# Patient Record
Sex: Male | Born: 1965 | Race: White | Hispanic: No | Marital: Married | Smoking: Never smoker
Health system: Southern US, Community
[De-identification: ages and names within clinical notes are randomized; demographics above are authoritative.]

## PROBLEM LIST (undated history)

## (undated) DIAGNOSIS — K429 Umbilical hernia without obstruction or gangrene: Secondary | ICD-10-CM

## (undated) DIAGNOSIS — M109 Gout, unspecified: Secondary | ICD-10-CM

## (undated) DIAGNOSIS — E7800 Pure hypercholesterolemia, unspecified: Secondary | ICD-10-CM

## (undated) DIAGNOSIS — G4733 Obstructive sleep apnea (adult) (pediatric): Secondary | ICD-10-CM

## (undated) DIAGNOSIS — J309 Allergic rhinitis, unspecified: Secondary | ICD-10-CM

## (undated) DIAGNOSIS — L719 Rosacea, unspecified: Secondary | ICD-10-CM

## (undated) HISTORY — DX: Gout, unspecified: M10.9

## (undated) HISTORY — DX: Umbilical hernia without obstruction or gangrene: K42.9

## (undated) HISTORY — DX: Rosacea, unspecified: L71.9

## (undated) HISTORY — PX: OTHER SURGICAL HISTORY: SHX169

## (undated) HISTORY — DX: Allergic rhinitis, unspecified: J30.9

## (undated) HISTORY — DX: Morbid (severe) obesity due to excess calories: E66.01

## (undated) HISTORY — DX: Obstructive sleep apnea (adult) (pediatric): G47.33

## (undated) HISTORY — PX: FACIAL COSMETIC SURGERY: SHX629

## (undated) HISTORY — DX: Pure hypercholesterolemia, unspecified: E78.00

---

## 2004-07-12 ENCOUNTER — Ambulatory Visit: Payer: Self-pay | Admitting: Internal Medicine

## 2004-08-25 ENCOUNTER — Ambulatory Visit: Payer: Self-pay | Admitting: Endocrinology

## 2004-09-01 ENCOUNTER — Ambulatory Visit: Payer: Self-pay | Admitting: Endocrinology

## 2004-10-28 ENCOUNTER — Ambulatory Visit: Payer: Self-pay | Admitting: Endocrinology

## 2004-11-03 ENCOUNTER — Ambulatory Visit: Payer: Self-pay | Admitting: Endocrinology

## 2011-01-23 ENCOUNTER — Other Ambulatory Visit (INDEPENDENT_AMBULATORY_CARE_PROVIDER_SITE_OTHER): Payer: Self-pay | Admitting: General Surgery

## 2011-01-23 ENCOUNTER — Encounter (HOSPITAL_COMMUNITY): Payer: Managed Care, Other (non HMO)

## 2011-01-23 LAB — SURGICAL PCR SCREEN
MRSA, PCR: NEGATIVE
Staphylococcus aureus: POSITIVE — AB

## 2011-01-23 LAB — BASIC METABOLIC PANEL
CO2: 27 mEq/L (ref 19–32)
Chloride: 102 mEq/L (ref 96–112)
Glucose, Bld: 95 mg/dL (ref 70–99)
Potassium: 4.4 mEq/L (ref 3.5–5.1)
Sodium: 137 mEq/L (ref 135–145)

## 2011-01-23 LAB — DIFFERENTIAL
Basophils Relative: 1 % (ref 0–1)
Eosinophils Absolute: 0.4 10*3/uL (ref 0.0–0.7)
Monocytes Relative: 10 % (ref 3–12)
Neutro Abs: 4 10*3/uL (ref 1.7–7.7)
Neutrophils Relative %: 54 % (ref 43–77)

## 2011-01-23 LAB — CBC
Hemoglobin: 15.8 g/dL (ref 13.0–17.0)
MCH: 29.9 pg (ref 26.0–34.0)
Platelets: 273 10*3/uL (ref 150–400)
RBC: 5.28 MIL/uL (ref 4.22–5.81)
WBC: 7.4 10*3/uL (ref 4.0–10.5)

## 2011-01-28 ENCOUNTER — Ambulatory Visit (HOSPITAL_COMMUNITY)
Admission: RE | Admit: 2011-01-28 | Discharge: 2011-01-28 | Disposition: A | Discharge status: Home / Self Care | Payer: Managed Care, Other (non HMO) | Source: Ambulatory Visit | Attending: General Surgery | Admitting: General Surgery

## 2011-01-28 DIAGNOSIS — K429 Umbilical hernia without obstruction or gangrene: Secondary | ICD-10-CM | POA: Insufficient documentation

## 2011-01-28 DIAGNOSIS — Z01810 Encounter for preprocedural cardiovascular examination: Secondary | ICD-10-CM | POA: Insufficient documentation

## 2011-01-28 DIAGNOSIS — Z01812 Encounter for preprocedural laboratory examination: Secondary | ICD-10-CM | POA: Insufficient documentation

## 2011-01-28 DIAGNOSIS — E669 Obesity, unspecified: Secondary | ICD-10-CM | POA: Insufficient documentation

## 2011-01-29 NOTE — Op Note (Signed)
NAMEPEIGHTON, EDGIN NO.:  [id number]  MEDICAL RECORD NO.:  [id number]  LOCATION:  DAYL                         FACILITY:  [facility]  PHYSICIAN:  Juanetta Gosling, MDDATE OF BIRTH:  06-17-1965  DATE OF PROCEDURE:  01/28/2011 DATE OF DISCHARGE:                              OPERATIVE REPORT   PREOPERATIVE DIAGNOSIS:  Umbilical hernia.  POSTOPERATIVE DIAGNOSIS:  Umbilical hernia.  PROCEDURE:  Umbilical repair with proceed ventral patch.  SURGEON:  Juanetta Gosling, MD  ASSISTANT:  None.  ANESTHESIA:  General.  SPECIMENS:  None.  DRAINS:  None.  COMPLICATIONS:  None.  BLOOD LOSS:  Minimal.  DISPOSITION:  To recovery room in stable condition.  INDICATIONS:  This is a 45 year old male who had about a several-month history of umbilical boluses and has remained about the same.  He has had some symptoms associated, which are better relieved by lying down. I saw him initially in May, he wanted to wait until he had some steps worked out, at home, that he would be able to have this repaired.  He is scheduled for September.  He and I discussed again the risks and benefits associated with umbilical hernia repair.  DESCRIPTION OF PROCEDURE:  After informed consent was obtained, the patient was taken to the operating room.  He was administered 1 g of intravenous cefazolin.  There was a questionable rash that he develops, so we stopped that and administered ciprofloxacin.  He had sequential compression devices placed on lower extremities prior to induction with anesthesia.  He was then placed under anesthesia without complication. His abdomen was prepped and draped in standard sterile surgical fashion. Surgical time-out was then performed.  I then made an incision below his umbilicus in a curvilinear fashion. This was carried down to the level of umbilical stalk.  This was encircled with a Kelly clamp.  I then divided this and pulled the umbilicus up.  I  then reduced the hernia, it was about a centimeter and half size hernias and due to his body habitus, I elected to repair this with mesh.  I placed a Proceed ventral patch in place.  I had a fair amount of preperitoneal fat that I was able to clean off as well.  I then placed the bottom of the layer of the mesh flat against the abdominal wall and spread this open and I then pulled the connectors up. I did close either end of the defect with #1 Ethibond figure-of-eight sutures.  I then tacked the straps to the edge of the fascia and then tacked the edges of the straps down.  This appeared to obliterate the defect completely.  I then tacked his umbilicus down with 2-0 Vicryl, then closed the skin with 3-0 Vicryl, 4-0 Monocryl.  Dermabond was placed over this, 10 mL of 0.25% Marcaine were filtrated throughout this.  He tolerated this well, was extubated, and was transferred to recovery in stable condition.     Juanetta Gosling, MD     MCW/MEDQ  D:  01/28/2011  T:  01/28/2011  Job:  [id number]  cc:   Sigmund Hazel, M.D. Fax: [phone]  Electronically Signed by Emelia Loron MD on 01/29/2011 06:24:48 PM

## 2011-02-02 ENCOUNTER — Telehealth (INDEPENDENT_AMBULATORY_CARE_PROVIDER_SITE_OTHER): Payer: Self-pay

## 2011-02-02 NOTE — Telephone Encounter (Signed)
Pt called complaining of a lump a little above his umbilicus where the hernia was repaired.  He states it is not painful and he has no fever.  I told him this is probably a seroma and will get better.  He will give it more time.

## 2011-02-04 HISTORY — PX: HERNIA REPAIR: SHX51

## 2011-02-23 ENCOUNTER — Encounter (INDEPENDENT_AMBULATORY_CARE_PROVIDER_SITE_OTHER): Payer: Self-pay | Admitting: General Surgery

## 2011-02-23 ENCOUNTER — Ambulatory Visit (INDEPENDENT_AMBULATORY_CARE_PROVIDER_SITE_OTHER): Payer: Managed Care, Other (non HMO) | Admitting: General Surgery

## 2011-02-23 VITALS — BP 138/92 | HR 66 | Temp 97.1°F | Resp 16 | Ht 73.0 in | Wt 261.2 lb

## 2011-02-23 DIAGNOSIS — Z09 Encounter for follow-up examination after completed treatment for conditions other than malignant neoplasm: Secondary | ICD-10-CM

## 2011-02-23 NOTE — Progress Notes (Signed)
Subjective:     Patient ID: Gary Powers, male   DOB: 1965/08/26, 45 y.o.   MRN: [id number]  HPI This is a 45 year old male who is now almost a month out from an umbilical hernia repair with mesh. He is doing really well with no real complaints at all. He is still not back to normal his normal activity per the restrictions afterwards. He he does have some swelling that is present around the umbilicus and it looks like his umbilicus was lifted up with a little bit of a seroma. His otherwise has no complaints.  Review of Systems     Objective:   Physical Exam Well healed umbilical incision with seroma present      Assessment:     S/p uh repair    Plan:        He is doing very well I released and full activity now. He does have a seroma in his umbilicus is lifted up a little bit. I discussed with him placing a piece of gauze to try to stick this back down. I told him it would likely do it on its own over some time as well. He is willing to see what happens over some time. I'm going to see him back as needed. I told him if this area still bothering him and exceed most please call me.

## 2013-03-24 ENCOUNTER — Encounter: Payer: Self-pay | Admitting: Cardiology

## 2016-08-24 ENCOUNTER — Ambulatory Visit (INDEPENDENT_AMBULATORY_CARE_PROVIDER_SITE_OTHER): Payer: BLUE CROSS/BLUE SHIELD | Admitting: Psychology

## 2016-08-24 DIAGNOSIS — F4320 Adjustment disorder, unspecified: Secondary | ICD-10-CM

## 2016-09-21 ENCOUNTER — Ambulatory Visit: Payer: BLUE CROSS/BLUE SHIELD | Admitting: Psychology

## 2016-09-25 DIAGNOSIS — Z1211 Encounter for screening for malignant neoplasm of colon: Secondary | ICD-10-CM | POA: Diagnosis not present

## 2016-09-25 DIAGNOSIS — D126 Benign neoplasm of colon, unspecified: Secondary | ICD-10-CM | POA: Diagnosis not present

## 2016-09-29 DIAGNOSIS — D126 Benign neoplasm of colon, unspecified: Secondary | ICD-10-CM | POA: Diagnosis not present

## 2017-01-07 DIAGNOSIS — J069 Acute upper respiratory infection, unspecified: Secondary | ICD-10-CM | POA: Diagnosis not present

## 2017-04-17 ENCOUNTER — Emergency Department (HOSPITAL_BASED_OUTPATIENT_CLINIC_OR_DEPARTMENT_OTHER): Payer: BLUE CROSS/BLUE SHIELD

## 2017-04-17 ENCOUNTER — Encounter (HOSPITAL_BASED_OUTPATIENT_CLINIC_OR_DEPARTMENT_OTHER): Payer: Self-pay | Admitting: Emergency Medicine

## 2017-04-17 ENCOUNTER — Emergency Department (HOSPITAL_BASED_OUTPATIENT_CLINIC_OR_DEPARTMENT_OTHER)
Admission: EM | Admit: 2017-04-17 | Discharge: 2017-04-17 | Disposition: A | Discharge status: Home / Self Care | Payer: BLUE CROSS/BLUE SHIELD | Attending: Emergency Medicine | Admitting: Emergency Medicine

## 2017-04-17 ENCOUNTER — Other Ambulatory Visit: Payer: Self-pay

## 2017-04-17 DIAGNOSIS — S59802A Other specified injuries of left elbow, initial encounter: Secondary | ICD-10-CM | POA: Diagnosis not present

## 2017-04-17 DIAGNOSIS — W010XXA Fall on same level from slipping, tripping and stumbling without subsequent striking against object, initial encounter: Secondary | ICD-10-CM | POA: Insufficient documentation

## 2017-04-17 DIAGNOSIS — Y999 Unspecified external cause status: Secondary | ICD-10-CM | POA: Insufficient documentation

## 2017-04-17 DIAGNOSIS — S46212A Strain of muscle, fascia and tendon of other parts of biceps, left arm, initial encounter: Secondary | ICD-10-CM | POA: Insufficient documentation

## 2017-04-17 DIAGNOSIS — S59912A Unspecified injury of left forearm, initial encounter: Secondary | ICD-10-CM | POA: Diagnosis not present

## 2017-04-17 DIAGNOSIS — S4992XA Unspecified injury of left shoulder and upper arm, initial encounter: Secondary | ICD-10-CM | POA: Diagnosis not present

## 2017-04-17 DIAGNOSIS — Y9389 Activity, other specified: Secondary | ICD-10-CM | POA: Insufficient documentation

## 2017-04-17 DIAGNOSIS — S59902A Unspecified injury of left elbow, initial encounter: Secondary | ICD-10-CM | POA: Diagnosis not present

## 2017-04-17 DIAGNOSIS — M79632 Pain in left forearm: Secondary | ICD-10-CM | POA: Diagnosis not present

## 2017-04-17 DIAGNOSIS — Y92520 Airport as the place of occurrence of the external cause: Secondary | ICD-10-CM | POA: Insufficient documentation

## 2017-04-17 DIAGNOSIS — M79622 Pain in left upper arm: Secondary | ICD-10-CM | POA: Diagnosis not present

## 2017-04-17 NOTE — ED Notes (Signed)
PMS intact before and after. Pt tolerated well. All questions answered. Per MD pt is only to have ace wrap and sling not a long arm splint.

## 2017-04-17 NOTE — ED Notes (Signed)
ED Provider at bedside. 

## 2017-04-17 NOTE — ED Notes (Signed)
Pt given d/c instructions as per chart. Verbalizes understanding. No questions. 

## 2017-04-17 NOTE — ED Triage Notes (Signed)
PT presents with c/o left arm pain and swelling after he [place] at airport and hit his upper arm on steel. Swelling noted to left AC area pt unable to rotate forearm

## 2017-04-17 NOTE — ED Provider Notes (Signed)
[facility] EMERGENCY DEPARTMENT Provider Note   CSN: [id number] Arrival date & time: 04/17/17  1931     History   Chief Complaint Chief Complaint  Patient presents with  . Arm Injury    HPI Gary Powers is a 51 y.o. male.  The history is provided by the patient. No language interpreter was used.  Arm Injury      Gary Powers is a 51 y.o. male who presents to the Emergency Department complaining of arm injury.  He was walking through the airport today when he tripped and fell, striking his left arm.  He believes he struck the outer portion of his upper arm but he is unsure.  He reports pain throughout his elbow but predominantly around his AC fossa.  Pain is worse with flexion of the elbow as well as pronation.  No numbness or weakness.  No prior similar symptoms.  He is right-hand dominant.  History reviewed. No pertinent past medical history.  There are no active problems to display for this patient.   Past Surgical History:  Procedure Laterality Date  . HERNIA REPAIR  02/04/11   [facility] with PVP       Home Medications    Prior to Admission medications   Not on File    Family History Family History  Problem Relation Age of Onset  . Arthritis Mother   . Cancer Father        lung    Social History Social History   Tobacco Use  . Smoking status: Never Smoker  . Smokeless tobacco: Never Used  Substance Use Topics  . Alcohol use: Yes    Comment: 3 drinks per week  . Drug use: No     Allergies   Patient has no known allergies.   Review of Systems Review of Systems  All other systems reviewed and are negative.    Physical Exam Updated Vital Signs BP (!) 158/95 (BP Location: Right Arm)   Pulse 68   Temp 97.7 F (36.5 C) (Oral)   Resp 20   Ht 6' (1.829 m)   Wt 122.5 kg (270 lb)   SpO2 99%   BMI 36.62 kg/m   Physical Exam  Constitutional: He is oriented to person, place, and time. He appears well-developed and  well-nourished.  HENT:  Head: Normocephalic and atraumatic.  Neck: Neck supple.  Cardiovascular: Normal rate and regular rhythm.  Pulmonary/Chest: Effort normal. No respiratory distress.  Musculoskeletal:  2+ radial pulses bilaterally.  There is mild tenderness to palpation over the [facility] fossa with no discrete bony tenderness to palpation.  Flexion extension is intact and the elbow and range of motion is presents with throughout the shoulder and wrist.  There is pulling of the skin in the [facility] fossa with an asymmetry compared to the unaffected side.  Neurological: He is alert and oriented to person, place, and time.  5 out of 5 grip strength bilaterally with sensation to light touch intact bilaterally.  Skin: Skin is warm and dry. Capillary refill takes less than 2 seconds.  Psychiatric: He has a normal mood and affect. His behavior is normal.  Nursing note and vitals reviewed.    ED Treatments / Results  Labs (all labs ordered are listed, but only abnormal results are displayed) Labs Reviewed - No data to display  EKG  EKG Interpretation None       Radiology Dg Elbow Complete Left  Result Date: 04/17/2017 CLINICAL DATA:  [place]  at airport today. EXAM: LEFT ELBOW - COMPLETE 3+ VIEW COMPARISON:  None. FINDINGS: There is no evidence of fracture, dislocation, or joint effusion. There is no evidence of arthropathy or other focal bone abnormality. Soft tissues are unremarkable. IMPRESSION: Negative. Electronically Signed   By: Awilda Metroourtnay  Bloomer M.D.   On: 04/17/2017 22:45   Dg Forearm Left  Result Date: 04/17/2017 CLINICAL DATA:  Pain following fall EXAM: LEFT FOREARM - 2 VIEW COMPARISON:  None. FINDINGS: No fracture or dislocation. Joint spaces appear normal. No elbow joint effusion. There is a minus ulnar variant. IMPRESSION: No fracture or dislocation. No appreciable arthropathy. There is a minus ulnar variant. Electronically Signed   By: Bretta BangWilliam  Woodruff III M.D.   On:  04/17/2017 20:12   Dg Humerus Left  Result Date: 04/17/2017 CLINICAL DATA:  Pain after trauma. EXAM: LEFT HUMERUS - 2+ VIEW COMPARISON:  None. FINDINGS: There is no evidence of fracture or other focal bone lesions. Soft tissues are unremarkable. IMPRESSION: Negative. Electronically Signed   By: Gerome Samavid  Williams III M.D   On: 04/17/2017 20:13    Procedures Procedures (including critical care time)  Medications Ordered in ED Medications - No data to display   Initial Impression / Assessment and Plan / ED Course  I have reviewed the triage vital signs and the nursing notes.  Pertinent labs & imaging results that were available during my care of the patient were reviewed by me and considered in my medical decision making (see chart for details).    Patient here for evaluation of left arm pain following a fall earlier today.  He is neurovascularly intact on examination but does have some exam findings concerning for a biceps tendon rupture.  Plan to place in sling with compressive Ace wrap for comfort.  Discussed importance of orthopedics follow-up as well as return precautions.  Recommend ibuprofen at home as needed for pain.    Final Clinical Impressions(s) / ED Diagnoses   Final diagnoses:  Rupture of left biceps tendon, initial encounter    ED Discharge Orders    None       Tilden Fossaees, Kripa Foskey, MD 04/18/17 [phone]

## 2017-04-20 DIAGNOSIS — M25522 Pain in left elbow: Secondary | ICD-10-CM | POA: Diagnosis not present

## 2017-04-22 DIAGNOSIS — M25522 Pain in left elbow: Secondary | ICD-10-CM | POA: Diagnosis not present

## 2017-05-05 DIAGNOSIS — G8918 Other acute postprocedural pain: Secondary | ICD-10-CM | POA: Diagnosis not present

## 2017-05-05 DIAGNOSIS — S46292A Other injury of muscle, fascia and tendon of other parts of biceps, left arm, initial encounter: Secondary | ICD-10-CM | POA: Diagnosis not present

## 2017-05-05 DIAGNOSIS — X58XXXA Exposure to other specified factors, initial encounter: Secondary | ICD-10-CM | POA: Diagnosis not present

## 2017-05-05 DIAGNOSIS — M66829 Spontaneous rupture of other tendons, unspecified upper arm: Secondary | ICD-10-CM | POA: Diagnosis not present

## 2017-05-05 DIAGNOSIS — M66822 Spontaneous rupture of other tendons, left upper arm: Secondary | ICD-10-CM | POA: Diagnosis not present

## 2017-05-05 DIAGNOSIS — Y999 Unspecified external cause status: Secondary | ICD-10-CM | POA: Diagnosis not present

## 2017-05-18 DIAGNOSIS — M66822 Spontaneous rupture of other tendons, left upper arm: Secondary | ICD-10-CM | POA: Diagnosis not present

## 2017-05-21 DIAGNOSIS — M25622 Stiffness of left elbow, not elsewhere classified: Secondary | ICD-10-CM | POA: Diagnosis not present

## 2017-05-21 DIAGNOSIS — R531 Weakness: Secondary | ICD-10-CM | POA: Diagnosis not present

## 2017-05-21 DIAGNOSIS — M669 Spontaneous rupture of unspecified tendon: Secondary | ICD-10-CM | POA: Diagnosis not present

## 2017-05-21 DIAGNOSIS — S46202D Unspecified injury of muscle, fascia and tendon of other parts of biceps, left arm, subsequent encounter: Secondary | ICD-10-CM | POA: Diagnosis not present

## 2017-05-31 DIAGNOSIS — R531 Weakness: Secondary | ICD-10-CM | POA: Diagnosis not present

## 2017-05-31 DIAGNOSIS — M669 Spontaneous rupture of unspecified tendon: Secondary | ICD-10-CM | POA: Diagnosis not present

## 2017-05-31 DIAGNOSIS — S46202D Unspecified injury of muscle, fascia and tendon of other parts of biceps, left arm, subsequent encounter: Secondary | ICD-10-CM | POA: Diagnosis not present

## 2017-05-31 DIAGNOSIS — M25622 Stiffness of left elbow, not elsewhere classified: Secondary | ICD-10-CM | POA: Diagnosis not present

## 2017-06-15 DIAGNOSIS — M25622 Stiffness of left elbow, not elsewhere classified: Secondary | ICD-10-CM | POA: Diagnosis not present

## 2017-06-16 DIAGNOSIS — S46202D Unspecified injury of muscle, fascia and tendon of other parts of biceps, left arm, subsequent encounter: Secondary | ICD-10-CM | POA: Diagnosis not present

## 2017-06-16 DIAGNOSIS — R531 Weakness: Secondary | ICD-10-CM | POA: Diagnosis not present

## 2017-06-16 DIAGNOSIS — M25622 Stiffness of left elbow, not elsewhere classified: Secondary | ICD-10-CM | POA: Diagnosis not present

## 2017-06-23 DIAGNOSIS — M669 Spontaneous rupture of unspecified tendon: Secondary | ICD-10-CM | POA: Diagnosis not present

## 2017-06-23 DIAGNOSIS — S46202D Unspecified injury of muscle, fascia and tendon of other parts of biceps, left arm, subsequent encounter: Secondary | ICD-10-CM | POA: Diagnosis not present

## 2017-06-23 DIAGNOSIS — R531 Weakness: Secondary | ICD-10-CM | POA: Diagnosis not present

## 2017-06-23 DIAGNOSIS — M25622 Stiffness of left elbow, not elsewhere classified: Secondary | ICD-10-CM | POA: Diagnosis not present

## 2017-07-05 DIAGNOSIS — M25622 Stiffness of left elbow, not elsewhere classified: Secondary | ICD-10-CM | POA: Diagnosis not present

## 2017-07-05 DIAGNOSIS — S46202D Unspecified injury of muscle, fascia and tendon of other parts of biceps, left arm, subsequent encounter: Secondary | ICD-10-CM | POA: Diagnosis not present

## 2017-07-05 DIAGNOSIS — M669 Spontaneous rupture of unspecified tendon: Secondary | ICD-10-CM | POA: Diagnosis not present

## 2017-07-15 DIAGNOSIS — F4323 Adjustment disorder with mixed anxiety and depressed mood: Secondary | ICD-10-CM | POA: Diagnosis not present

## 2017-07-20 DIAGNOSIS — F4323 Adjustment disorder with mixed anxiety and depressed mood: Secondary | ICD-10-CM | POA: Diagnosis not present

## 2017-07-26 DIAGNOSIS — F4323 Adjustment disorder with mixed anxiety and depressed mood: Secondary | ICD-10-CM | POA: Diagnosis not present

## 2017-07-27 DIAGNOSIS — M25622 Stiffness of left elbow, not elsewhere classified: Secondary | ICD-10-CM | POA: Diagnosis not present

## 2017-11-29 DIAGNOSIS — E7800 Pure hypercholesterolemia, unspecified: Secondary | ICD-10-CM | POA: Diagnosis not present

## 2017-11-29 DIAGNOSIS — M109 Gout, unspecified: Secondary | ICD-10-CM | POA: Diagnosis not present

## 2018-01-21 DIAGNOSIS — N50811 Right testicular pain: Secondary | ICD-10-CM | POA: Diagnosis not present

## 2018-01-21 DIAGNOSIS — Z202 Contact with and (suspected) exposure to infections with a predominantly sexual mode of transmission: Secondary | ICD-10-CM | POA: Diagnosis not present

## 2018-08-23 IMAGING — CR DG FOREARM 2V*L*
2 series · 2 of 2 positions shown · non-contrast
Comparison: None.

CLINICAL DATA: Pain following fall

EXAM:
LEFT FOREARM - 2 VIEW

[x forearm ap left]
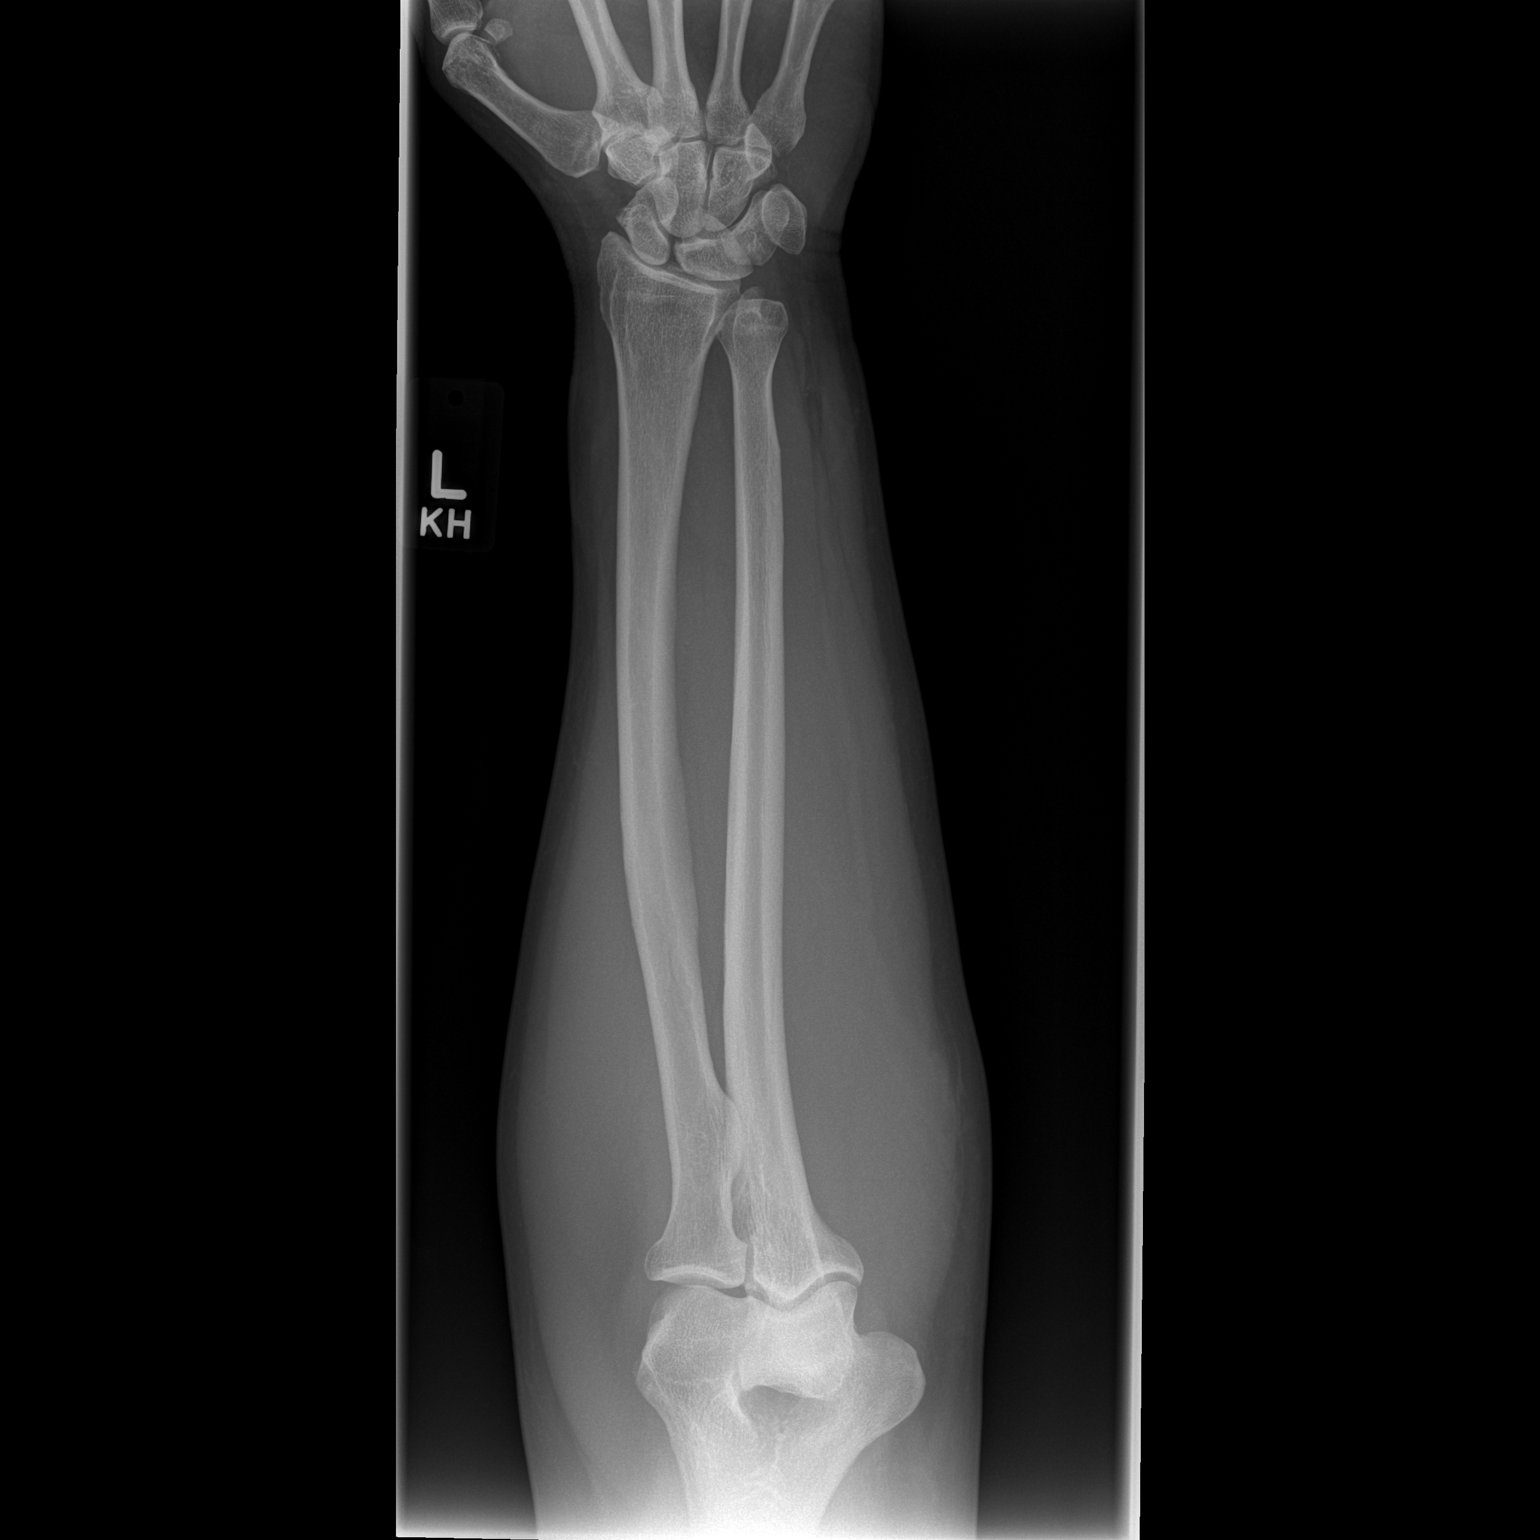

[x forearm lat left]
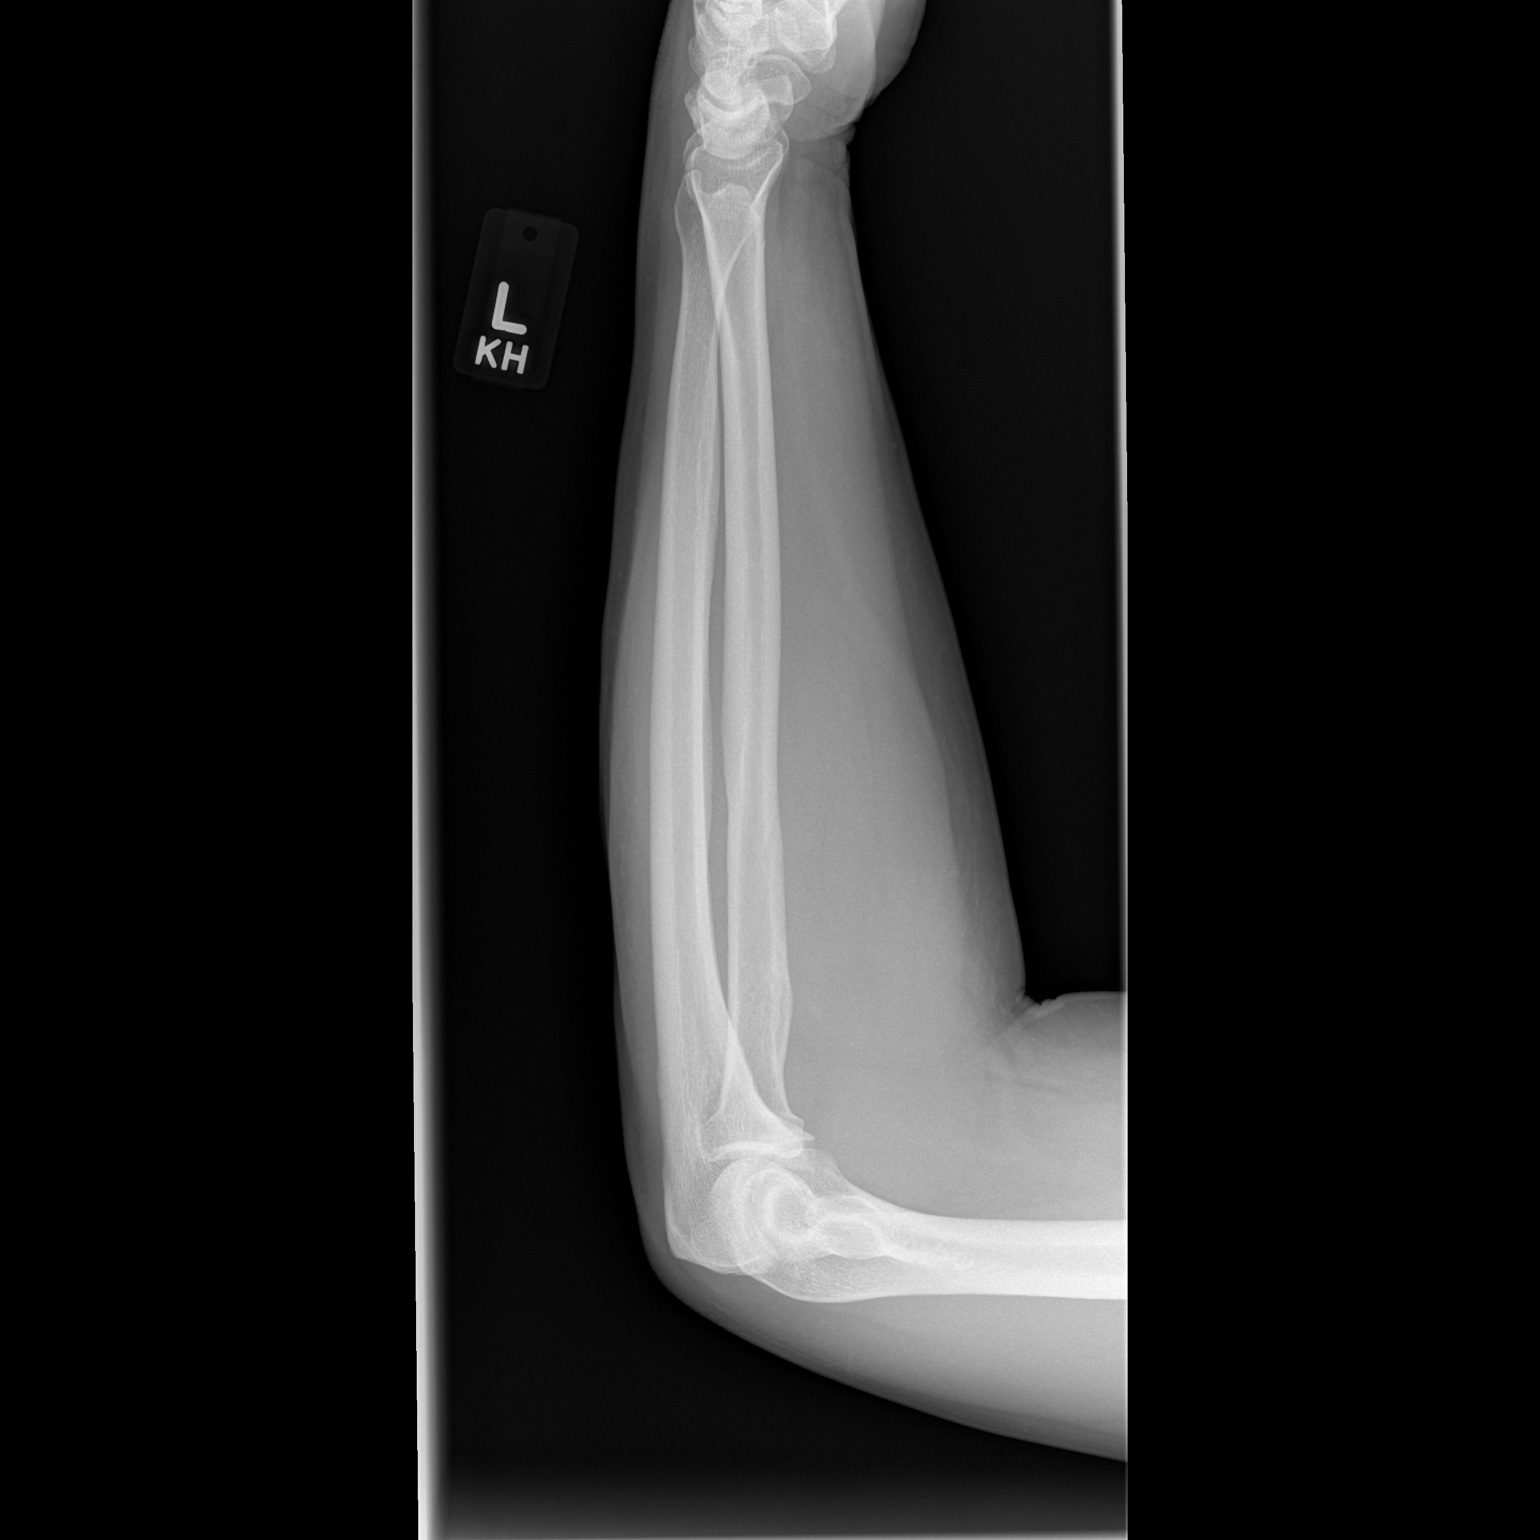

[2 of 2 positions shown; findings below may reference images not displayed]

FINDINGS: No fracture or dislocation. Joint spaces appear normal. No elbow
joint effusion. There is a minus ulnar variant.
IMPRESSION: No fracture or dislocation. No appreciable arthropathy. There is a
minus ulnar variant.

## 2018-11-29 DIAGNOSIS — H5789 Other specified disorders of eye and adnexa: Secondary | ICD-10-CM | POA: Diagnosis not present

## 2018-11-29 DIAGNOSIS — E7800 Pure hypercholesterolemia, unspecified: Secondary | ICD-10-CM | POA: Diagnosis not present

## 2018-11-29 DIAGNOSIS — M109 Gout, unspecified: Secondary | ICD-10-CM | POA: Diagnosis not present

## 2019-09-07 DIAGNOSIS — J029 Acute pharyngitis, unspecified: Secondary | ICD-10-CM | POA: Diagnosis not present

## 2019-09-07 DIAGNOSIS — Z20818 Contact with and (suspected) exposure to other bacterial communicable diseases: Secondary | ICD-10-CM | POA: Diagnosis not present

## 2020-07-11 DIAGNOSIS — G4733 Obstructive sleep apnea (adult) (pediatric): Secondary | ICD-10-CM | POA: Diagnosis not present

## 2020-11-12 DIAGNOSIS — R635 Abnormal weight gain: Secondary | ICD-10-CM | POA: Diagnosis not present

## 2020-11-12 DIAGNOSIS — M109 Gout, unspecified: Secondary | ICD-10-CM | POA: Diagnosis not present

## 2020-11-12 DIAGNOSIS — Z23 Encounter for immunization: Secondary | ICD-10-CM | POA: Diagnosis not present

## 2020-11-12 DIAGNOSIS — Z0000 Encounter for general adult medical examination without abnormal findings: Secondary | ICD-10-CM | POA: Diagnosis not present

## 2020-11-12 DIAGNOSIS — E7800 Pure hypercholesterolemia, unspecified: Secondary | ICD-10-CM | POA: Diagnosis not present

## 2020-11-12 DIAGNOSIS — M25541 Pain in joints of right hand: Secondary | ICD-10-CM | POA: Diagnosis not present

## 2020-11-12 DIAGNOSIS — Z125 Encounter for screening for malignant neoplasm of prostate: Secondary | ICD-10-CM | POA: Diagnosis not present

## 2022-06-11 DIAGNOSIS — E7800 Pure hypercholesterolemia, unspecified: Secondary | ICD-10-CM | POA: Diagnosis not present

## 2022-06-11 DIAGNOSIS — R635 Abnormal weight gain: Secondary | ICD-10-CM | POA: Diagnosis not present

## 2022-06-11 DIAGNOSIS — M109 Gout, unspecified: Secondary | ICD-10-CM | POA: Diagnosis not present

## 2022-06-12 DIAGNOSIS — E7800 Pure hypercholesterolemia, unspecified: Secondary | ICD-10-CM | POA: Diagnosis not present

## 2022-06-12 DIAGNOSIS — M109 Gout, unspecified: Secondary | ICD-10-CM | POA: Diagnosis not present

## 2022-06-12 DIAGNOSIS — Z125 Encounter for screening for malignant neoplasm of prostate: Secondary | ICD-10-CM | POA: Diagnosis not present

## 2022-08-10 DIAGNOSIS — D485 Neoplasm of uncertain behavior of skin: Secondary | ICD-10-CM | POA: Diagnosis not present

## 2022-08-10 DIAGNOSIS — D2261 Melanocytic nevi of right upper limb, including shoulder: Secondary | ICD-10-CM | POA: Diagnosis not present

## 2022-08-10 DIAGNOSIS — D225 Melanocytic nevi of trunk: Secondary | ICD-10-CM | POA: Diagnosis not present

## 2022-08-10 DIAGNOSIS — L821 Other seborrheic keratosis: Secondary | ICD-10-CM | POA: Diagnosis not present

## 2022-08-10 DIAGNOSIS — L814 Other melanin hyperpigmentation: Secondary | ICD-10-CM | POA: Diagnosis not present

## 2022-09-03 DIAGNOSIS — M109 Gout, unspecified: Secondary | ICD-10-CM | POA: Diagnosis not present

## 2022-09-03 DIAGNOSIS — Z1389 Encounter for screening for other disorder: Secondary | ICD-10-CM | POA: Diagnosis not present

## 2022-09-03 DIAGNOSIS — R739 Hyperglycemia, unspecified: Secondary | ICD-10-CM | POA: Diagnosis not present

## 2022-09-03 DIAGNOSIS — G4733 Obstructive sleep apnea (adult) (pediatric): Secondary | ICD-10-CM | POA: Diagnosis not present

## 2022-09-03 DIAGNOSIS — E669 Obesity, unspecified: Secondary | ICD-10-CM | POA: Diagnosis not present

## 2022-09-03 DIAGNOSIS — Z9189 Other specified personal risk factors, not elsewhere classified: Secondary | ICD-10-CM | POA: Diagnosis not present

## 2022-09-03 DIAGNOSIS — E7800 Pure hypercholesterolemia, unspecified: Secondary | ICD-10-CM | POA: Diagnosis not present

## 2022-09-03 DIAGNOSIS — E8889 Other specified metabolic disorders: Secondary | ICD-10-CM | POA: Diagnosis not present

## 2022-09-17 ENCOUNTER — Telehealth: Payer: Self-pay | Admitting: *Deleted

## 2022-09-17 NOTE — Telephone Encounter (Signed)
Received request from Dr Anne Fu to have this pt scheduled to be established with him as a new pt.  Left message on pt's voicemail to call back to schedule.

## 2022-09-21 DIAGNOSIS — E7800 Pure hypercholesterolemia, unspecified: Secondary | ICD-10-CM | POA: Diagnosis not present

## 2022-09-21 DIAGNOSIS — G4733 Obstructive sleep apnea (adult) (pediatric): Secondary | ICD-10-CM | POA: Diagnosis not present

## 2022-09-21 DIAGNOSIS — R7303 Prediabetes: Secondary | ICD-10-CM | POA: Diagnosis not present

## 2022-09-21 DIAGNOSIS — E559 Vitamin D deficiency, unspecified: Secondary | ICD-10-CM | POA: Diagnosis not present

## 2022-09-22 NOTE — Telephone Encounter (Signed)
Pt has been scheduled as new pt with Dr Anne Fu as requested.

## 2022-11-16 ENCOUNTER — Ambulatory Visit: Payer: BC Managed Care – PPO | Attending: Cardiology | Admitting: Cardiology

## 2022-11-16 ENCOUNTER — Encounter: Payer: Self-pay | Admitting: Cardiology

## 2022-11-16 VITALS — BP 146/92 | HR 67 | Ht 72.0 in | Wt 285.2 lb

## 2022-11-16 DIAGNOSIS — R0609 Other forms of dyspnea: Secondary | ICD-10-CM | POA: Diagnosis not present

## 2022-11-16 DIAGNOSIS — Z789 Other specified health status: Secondary | ICD-10-CM | POA: Diagnosis not present

## 2022-11-16 DIAGNOSIS — E785 Hyperlipidemia, unspecified: Secondary | ICD-10-CM

## 2022-11-16 DIAGNOSIS — I209 Angina pectoris, unspecified: Secondary | ICD-10-CM | POA: Diagnosis not present

## 2022-11-16 MED ORDER — METOPROLOL TARTRATE 25 MG PO TABS: 25.0000 mg | ORAL_TABLET | ORAL | 0 refills | Status: DC

## 2022-11-16 NOTE — Patient Instructions (Signed)
Medication Instructions:  The current medical regimen is effective;  continue present plan and medications.  *If you need a refill on your cardiac medications before your next appointment, please call your pharmacy*   Lab Work: None today If you have labs (blood work) drawn today and your tests are completely normal, you will receive your results only by: MyChart Message (if you have MyChart) OR A paper copy in the mail If you have any lab test that is abnormal or we need to change your treatment, we will call you to review the results.   You have been referred to our Lipid Clinic with our pharmacy team.  Testing/Procedures:   Your cardiac CT will be scheduled at:   [facility] [address] [place], [place] [address] [phone]  Please arrive at the [facility] and Children's Entrance (Entrance C2) of [facility] 30 minutes prior to test start time. You can use the FREE valet parking offered at entrance C (encouraged to control the heart rate for the test)  Proceed to the [facility] Radiology Department (first floor) to check-in and test prep.  All radiology patients and guests should use entrance C2 at [facility], accessed from [facility], even though the hospital's physical address listed is [address].    Please follow these instructions carefully (unless otherwise directed):  An IV will be required for this test and Nitroglycerin will be given.  Hold all erectile dysfunction medications at least 3 days (72 hrs) prior to test. (Ie viagra, cialis, sildenafil, tadalafil, etc)   On the Night Before the Test: Be sure to Drink plenty of water. Do not consume any caffeinated/decaffeinated beverages or chocolate 12 hours prior to your test. Do not take any antihistamines 12 hours prior to your test.  On the Day of the Test: Drink plenty of water until 1 hour prior to the test. Do not eat any food 1 hour prior to  test. You may take your regular medications prior to the test.  Take metoprolol (Lopressor) two hours prior to test. If you take Furosemide/Hydrochlorothiazide/Spironolactone, please HOLD on the morning of the test.      After the Test: Drink plenty of water. After receiving IV contrast, you may experience a mild flushed feeling. This is normal. On occasion, you may experience a mild rash up to 24 hours after the test. This is not dangerous. If this occurs, you can take Benadryl 25 mg and increase your fluid intake. If you experience trouble breathing, this can be serious. If it is severe call 911 IMMEDIATELY. If it is mild, please call our office.   We will call to schedule your test 2-4 weeks out understanding that some insurance companies will need an authorization prior to the service being performed.   For more information and frequently asked questions, please visit our website : [email or web address]  For non-scheduling related questions, please contact the cardiac imaging nurse navigator should you have any questions/concerns: Gary Powers, Cardiac Imaging Nurse Navigator Gary Powers, Cardiac Imaging Nurse Navigator [facility] Direct Office Dial: [phone]   For scheduling needs, including cancellations and rescheduling, please call [place], [phone].  Follow-Up: At [facility], you and your health needs are our priority.  As part of our continuing mission to provide you with exceptional heart care, we have created designated Provider Care Teams.  These Care Teams include your primary Cardiologist (physician) and Advanced Practice Providers (APPs -  Physician Assistants  and Nurse Practitioners) who all work together to provide you with the care you need, when you need it.  We recommend signing up for the patient portal called "MyChart".  Sign up information is provided on this After Visit Summary.  MyChart is used to  connect with patients for Virtual Visits (Telemedicine).  Patients are able to view lab/test results, encounter notes, upcoming appointments, etc.  Non-urgent messages can be sent to your provider as well.   To learn more about what you can do with MyChart, go to [email or web address].    Your next appointment:   Follow up will be based on the results of the above testing.

## 2022-11-16 NOTE — Progress Notes (Signed)
Cardiology Office Note:    Date:  11/16/2022   ID:  IZIK CHHOEUN, DOB 11/21/1965, MRN [id number]  PCP:  Jarrett Soho, PA-C   [facility] Providers Cardiologist:  None     Referring MD: No ref. provider found    History of Present Illness:    Gary Powers is a 57 y.o. male here for the evaluation of hyperlipidemia at the request of Dr. [name] with obstructive sleep apnea, morbid obesity, initial weight 277 pounds in April 2024 prior umbilical hernia repair [name] 2012.  Has tried in the past statins but has been intolerant.  Has tried Crestor Lipitor and Zocor. Pain in legs.   Mandy wife.  Wife and 3 sons.  Works in [employer], travels occasionally but since COVID has had more of a sedentary job at home.  Friends with Lynita Lombard. Tennis, [place].   He feels shortness of breath with activity which could be an anginal equivalent.  Could be deconditioning/weight but could also be potential underlying coronary artery disease.  Past Medical History:  Diagnosis Date   Allergic rhinitis    Gout    Hypercholesterolemia    Hypercholesterolemia    Morbid obesity due to excess calories (HCC)    OSA (obstructive sleep apnea)    Rosacea    Umbilical hernia     Past Surgical History:  Procedure Laterality Date   bicep reapir Left    FACIAL COSMETIC SURGERY     HERNIA REPAIR  02/04/2011   [facility] with [facility]    Current Medications: Current Meds  Medication Sig   allopurinol (ZYLOPRIM) 300 MG tablet Take 300 mg by mouth daily.   Cholecalciferol (VITAMIN D3) 1.25 MG (50000 UT) CAPS Take 1 capsule by mouth once a week.   metoprolol tartrate (LOPRESSOR) 25 MG tablet Take 1 tablet (25 mg total) by mouth as directed. Take one tablet (2) hours before your CT scan     Allergies:   Crestor [rosuvastatin], Lipitor [atorvastatin], and Zocor [simvastatin]   Social History   Socioeconomic History   Marital status: Married    Spouse name: Not on file   Number of  children: Not on file   Years of education: Not on file   Highest education level: Not on file  Occupational History   Not on file  Tobacco Use   Smoking status: Never   Smokeless tobacco: Never  Vaping Use   Vaping Use: Never used  Substance and Sexual Activity   Alcohol use: Yes    Alcohol/week: 12.0 standard drinks of alcohol    Types: 12 Cans of beer per week   Drug use: No   Sexual activity: Yes    Birth control/protection: Surgical  Other Topics Concern   Not on file  Social History Narrative   Not on file   Social Determinants of Health   Financial Resource Strain: Not on file  Food Insecurity: Not on file  Transportation Needs: Not on file  Physical Activity: Not on file  Stress: Not on file  Social Connections: Not on file     Family History: The patient's family history includes Arthritis in his mother; Cancer in his father; Diabetes in his mother; Obesity in his maternal grandmother and paternal grandmother.  ROS:   Please see the history of present illness.    Denies any fevers chills nausea vomiting syncope bleeding all other systems reviewed and are negative.  EKGs/Labs/Other Studies Reviewed:    The following studies were reviewed today: EKG, prior  clinic notes reviewed  EKG:   EKG Interpretation Date/Time:  Monday November 16 2022 08:49:18 EDT Ventricular Rate:  70 PR Interval:  182 QRS Duration:  82 QT Interval:  378 QTC Calculation: 408 R Axis:   3  Text Interpretation: Normal sinus rhythm When compared with ECG of 23-Jan-2011 11:01, No significant change was found Confirmed by [name] ([id number]) on 11/16/2022 8:52:07 AM   2012: 63 bpm with no other abnormalities  Recent Labs: No results found for requested labs within last 365 days.  Recent Lipid Panel No results found for: "CHOL", "TRIG", "HDL", "CHOLHDL", "VLDL", "LDLCALC", "LDLDIRECT"  Hemoglobin A1c 5.7, TSH 2.01 total cholesterol 297 triglycerides 132 HDL 67 LDL 206.  Creatinine  1.1  Risk Assessment/Calculations:              Physical Exam:    VS:  BP (!) 146/92   Pulse 67   Ht 6' (1.829 m)   Wt 285 lb 3.2 oz (129.4 kg)   SpO2 94%   BMI 38.68 kg/m     Wt Readings from Last 3 Encounters:  11/16/22 285 lb 3.2 oz (129.4 kg)  04/17/17 270 lb (122.5 kg)  02/23/11 261 lb 3.2 oz (118.5 kg)     GEN:  Well nourished, well developed in no acute distress HEENT: Normal NECK: No JVD; No carotid bruits LYMPHATICS: No lymphadenopathy CARDIAC: RRR, no murmurs, rubs, gallops RESPIRATORY:  Clear to auscultation without rales, wheezing or rhonchi  ABDOMEN: Soft, non-tender, non-distended MUSCULOSKELETAL:  No edema; No deformity  SKIN: Warm and dry NEUROLOGIC:  Alert and oriented x 3 PSYCHIATRIC:  Normal affect   ASSESSMENT:    1. DOE (dyspnea on exertion)   2. Hyperlipidemia, unspecified hyperlipidemia type   3. Angina pectoris (HCC)   4. Statin intolerance    PLAN:    In order of problems listed above:  Shortness of breath with activity/angina - Could be an anginal equivalent.  With hyperlipidemia, increasing severity of symptoms, we will go ahead and proceed with coronary CT scan to not only evaluate plaque to help guide our future therapies but to also evaluate for any significant stenosis present.  Hyperlipidemia with statin intolerance - LDL 206.  Has had elevated cholesterol for quite some time.  Has not tolerated Crestor Lipitor simvastatin.  We will for him to our lipid clinic to discuss PCSK9 inhibitor, Repatha for instance.  I think it is very important for him to lower his cholesterol to help with plaque stabilization and reduce his overall risk.  Morbid obesity - Working with wellness clinic at [place].  Mediterranean diet.  Hemoglobin A1c 5.7.  Nondiabetic.  Elevated blood pressure without diagnosis of hypertension - Check blood pressure recordings at home.  Let us know some results.  May need antihypertensive.          Medication  Adjustments/Labs and Tests Ordered: Current medicines are reviewed at length with the patient today.  Concerns regarding medicines are outlined above.  Orders Placed This Encounter  Procedures   CT CORONARY MORPH W/CTA COR W/SCORE W/CA W/CM &/OR WO/CM   AMB Referral to [facility]   EKG 12-Lead   Meds ordered this encounter  Medications   metoprolol tartrate (LOPRESSOR) 25 MG tablet    Sig: Take 1 tablet (25 mg total) by mouth as directed. Take one tablet (2) hours before your CT scan    Dispense:  1 tablet    Refill:  0    Patient Instructions  Medication Instructions:  The  current medical regimen is effective;  continue present plan and medications.  *If you need a refill on your cardiac medications before your next appointment, please call your pharmacy*   Lab Work: None today If you have labs (blood work) drawn today and your tests are completely normal, you will receive your results only by: MyChart Message (if you have MyChart) OR A paper copy in the mail If you have any lab test that is abnormal or we need to change your treatment, we will call you to review the results.   You have been referred to our Lipid Clinic with our pharmacy team.  Testing/Procedures:   Your cardiac CT will be scheduled at:   [facility] [address] [place], [place] [address] [phone]  Please arrive at the [facility] and Children's Entrance (Entrance C2) of [facility] 30 minutes prior to test start time. You can use the FREE valet parking offered at entrance C (encouraged to control the heart rate for the test)  Proceed to the [facility] Radiology Department (first floor) to check-in and test prep.  All radiology patients and guests should use entrance C2 at [facility], accessed from [facility], even though the hospital's physical address listed is [address]    Please follow these instructions carefully (unless  otherwise directed):  An IV will be required for this test and Nitroglycerin will be given.  Hold all erectile dysfunction medications at least 3 days (72 hrs) prior to test. (Ie viagra, cialis, sildenafil, tadalafil, etc)   On the Night Before the Test: Be sure to Drink plenty of water. Do not consume any caffeinated/decaffeinated beverages or chocolate 12 hours prior to your test. Do not take any antihistamines 12 hours prior to your test.  On the Day of the Test: Drink plenty of water until 1 hour prior to the test. Do not eat any food 1 hour prior to test. You may take your regular medications prior to the test.  Take metoprolol (Lopressor) two hours prior to test. If you take Furosemide/Hydrochlorothiazide/Spironolactone, please HOLD on the morning of the test.      After the Test: Drink plenty of water. After receiving IV contrast, you may experience a mild flushed feeling. This is normal. On occasion, you may experience a mild rash up to 24 hours after the test. This is not dangerous. If this occurs, you can take Benadryl 25 mg and increase your fluid intake. If you experience trouble breathing, this can be serious. If it is severe call 911 IMMEDIATELY. If it is mild, please call our office.   We will call to schedule your test 2-4 weeks out understanding that some insurance companies will need an authorization prior to the service being performed.   For more information and frequently asked questions, please visit our website : [email or web address]  For non-scheduling related questions, please contact the cardiac imaging nurse navigator should you have any questions/concerns: Rockwell Alexandria, Cardiac Imaging Nurse Navigator Larey Brick, Cardiac Imaging Nurse Navigator [facility] Direct Office Dial: [phone]   For scheduling needs, including cancellations and rescheduling, please call [place], [phone].  Follow-Up: At [facility], you and your health needs are our priority.  As part of our continuing mission to provide you with exceptional heart care, we have created designated Provider Care Teams.  These Care Teams include your primary Cardiologist (physician) and Advanced Practice Providers (APPs -  Physician Assistants and Nurse Practitioners)  who all work together to provide you with the care you need, when you need it.  We recommend signing up for the patient portal called "MyChart".  Sign up information is provided on this After Visit Summary.  MyChart is used to connect with patients for Virtual Visits (Telemedicine).  Patients are able to view lab/test results, encounter notes, upcoming appointments, etc.  Non-urgent messages can be sent to your provider as well.   To learn more about what you can do with MyChart, go to [email or web address].    Your next appointment:   Follow up will be based on the results of the above testing.    Signed, [name], MD  11/16/2022 9:24 AM    [facility]

## 2022-11-21 ENCOUNTER — Other Ambulatory Visit: Payer: Self-pay | Admitting: Cardiology

## 2022-12-09 ENCOUNTER — Ambulatory Visit: Payer: BC Managed Care – PPO | Attending: Internal Medicine | Admitting: Pharmacist

## 2022-12-09 ENCOUNTER — Telehealth: Payer: Self-pay | Admitting: Pharmacist

## 2022-12-09 DIAGNOSIS — E782 Mixed hyperlipidemia: Secondary | ICD-10-CM | POA: Diagnosis not present

## 2022-12-09 DIAGNOSIS — E785 Hyperlipidemia, unspecified: Secondary | ICD-10-CM | POA: Insufficient documentation

## 2022-12-09 DIAGNOSIS — I10 Essential (primary) hypertension: Secondary | ICD-10-CM | POA: Insufficient documentation

## 2022-12-09 DIAGNOSIS — M109 Gout, unspecified: Secondary | ICD-10-CM | POA: Insufficient documentation

## 2022-12-09 DIAGNOSIS — E669 Obesity, unspecified: Secondary | ICD-10-CM | POA: Insufficient documentation

## 2022-12-09 NOTE — Progress Notes (Signed)
Patient ID: Gary Powers                 DOB: Dec 17, 1965                    MRN: [id number]     HPI: Gary Powers is a 57 y.o. male patient referred to lipid clinic by Dr Anne Fu. PMH is significant for HLD, OSA, obesity, and gout. He reported SOB with activity at MD appt on 11/16/22 and is scheduled for coronary CTA on 8/8. He is intolerant to 3 statins and has an LDL > 200, here to discuss PCSK9i today.  Pt does not recall specific dosing with prior statin use, but experienced aches within 1-2 days of starting med in each case. Takes allopurinol for gout, has elevated uric acid on this still but has not had a gout flare in years.  Current Medications: none Intolerances: Crestor, Lipitor, Zocor - muscle aches Risk Factors: LDL > 200 LDL goal: 100mg /dL  Diet: cutting back on red meat, focusing more on Mediterranean diet  Exercise: walking, going to the gym, fishing, hunting  Family History: Arthritis in his mother; Cancer in his father; Diabetes in his mother; Obesity in his maternal grandmother and paternal grandmother.   Social History: 12 beers per week, no tobacco or drug use.  Labs: 06/12/22: TC 297, TG 132, HDL 67, LDL 206 - no LLT  Past Medical History:  Diagnosis Date   Allergic rhinitis    Gout    Hypercholesterolemia    Hypercholesterolemia    Morbid obesity due to excess calories (HCC)    OSA (obstructive sleep apnea)    Rosacea    Umbilical hernia     Current Outpatient Medications on File Prior to Visit  Medication Sig Dispense Refill   allopurinol (ZYLOPRIM) 300 MG tablet Take 300 mg by mouth daily.     Cholecalciferol (VITAMIN D3) 1.25 MG (50000 UT) CAPS Take 1 capsule by mouth once a week.     metoprolol tartrate (LOPRESSOR) 25 MG tablet TAKE 1 TABLET BY MOUTH 2 HOURS BEFORE CT SCAN 1 tablet 0   No current facility-administered medications on file prior to visit.    Allergies  Allergen Reactions   Crestor [Rosuvastatin]     MUSCLE ACHES     Lipitor [Atorvastatin]     MUSCLE ACHES    Zocor [Simvastatin]     MUSCLE ACHES     Assessment/Plan:  1. Hyperlipidemia - Baseline LDL 206, above goal < 100 for primary prevention. Discussed that goal may be intensified pending results of upcoming coronary CTA. He is intolerant to 3 statins. Will avoid bempedoic acid due to hyperuricemia side effect as pt already has history of gout. Will start Repatha 140mg  Q2W and recheck lipids in October. He will qualify for $5 copay card. If needed at that time, could add on ezetimibe.  Jiya Kissinger E. Greggory Safranek, PharmD, BCACP, CPP [facility] [address], [place], [place] [address] Phone: [phone]; Fax: [phone] 12/09/2022 11:49 AM

## 2022-12-09 NOTE — Patient Instructions (Signed)
Your LDL cholesterol is 206  Your current goal is < 100, it may be changed depending on the results of your upcoming coronary CTA  I will submit information to your insurance for Repatha and let you know when I hear back.    Repatha is a subcutaneous injection given once every 2 weeks in the fatty tissue of your stomach or upper outer thigh. Store the medication in the fridge. You can let your dose warm up to room temperature for 30 minutes before injecting if you prefer. Repatha will lower your LDL cholesterol by 60% and helps to lower your chance of having a heart attack or stroke.  We will recheck fasting labs on Tuesday, October 1, you can come any time after 7:30am

## 2022-12-09 NOTE — Telephone Encounter (Addendum)
Pt's insurance only covers Repatha for FH indication if [place] score is > 8 which his is not (his is only 3 for LDL > 200). Will pend PA submission and wait to see if his coronary CTA on 12/17/22 shows plaque, then could submit under ASCVD indication which would likely be easier to get coverage for. Pt aware that request won't be submitted until after his upcoming scan.

## 2022-12-16 ENCOUNTER — Telehealth (HOSPITAL_COMMUNITY): Payer: Self-pay | Admitting: *Deleted

## 2022-12-16 NOTE — Telephone Encounter (Signed)
Reaching out to patient to offer assistance regarding upcoming cardiac imaging study; pt verbalizes understanding of appt date/time, parking situation and where to check in, pre-test NPO status and medications ordered, and verified current allergies; name and call back number provided for further questions should they arise Tansy Lorek RN Navigator Cardiac Imaging [facility] [phone] office [phone] cell  

## 2022-12-16 NOTE — Telephone Encounter (Signed)
Attempted to call patient regarding upcoming cardiac CT appointment. Left message on voicemail with name and callback number Hayley Sharpe RN Navigator Cardiac Imaging [facility] [phone] Office   

## 2022-12-17 ENCOUNTER — Ambulatory Visit (HOSPITAL_COMMUNITY)
Admission: RE | Admit: 2022-12-17 | Discharge: 2022-12-17 | Disposition: A | Discharge status: Home / Self Care | Payer: BC Managed Care – PPO | Source: Ambulatory Visit | Attending: Cardiology | Admitting: Cardiology

## 2022-12-17 DIAGNOSIS — E785 Hyperlipidemia, unspecified: Secondary | ICD-10-CM | POA: Insufficient documentation

## 2022-12-17 DIAGNOSIS — R0609 Other forms of dyspnea: Secondary | ICD-10-CM | POA: Insufficient documentation

## 2022-12-17 DIAGNOSIS — I2510 Atherosclerotic heart disease of native coronary artery without angina pectoris: Secondary | ICD-10-CM | POA: Diagnosis not present

## 2022-12-17 MED ORDER — NITROGLYCERIN 0.4 MG SL SUBL
0.8000 mg | SUBLINGUAL_TABLET | Freq: Once | SUBLINGUAL | Status: AC
  Administered 2022-12-17: 0.8 mg via SUBLINGUAL

## 2022-12-17 MED ORDER — IOHEXOL 350 MG/ML SOLN
95.0000 mL | Freq: Once | INTRAVENOUS | Status: AC | PRN
  Administered 2022-12-17: 95 mL via INTRAVENOUS

## 2022-12-17 MED ORDER — NITROGLYCERIN 0.4 MG SL SUBL
SUBLINGUAL_TABLET | SUBLINGUAL | Status: AC
  Filled 2022-12-17: qty 2

## 2022-12-18 ENCOUNTER — Encounter: Payer: Self-pay | Admitting: Pharmacist

## 2022-12-18 ENCOUNTER — Other Ambulatory Visit (HOSPITAL_COMMUNITY): Payer: Self-pay

## 2022-12-18 ENCOUNTER — Telehealth: Payer: Self-pay

## 2022-12-18 ENCOUNTER — Telehealth: Payer: Self-pay | Admitting: Cardiology

## 2022-12-18 MED ORDER — REPATHA SURECLICK 140 MG/ML ~~LOC~~ SOAJ: 140.0000 mg | SUBCUTANEOUS | 3 refills | Status: AC

## 2022-12-18 NOTE — Telephone Encounter (Signed)
   A verbal Prior Authorization has been complete. The expected turn around time is 72hrs.  Case# [id number] Contact# [phone]

## 2022-12-18 NOTE — Telephone Encounter (Signed)
Pharmacy Patient Advocate Encounter   Received notification from CoverMyMeds that prior authorization for REPATHA 140mg /ml Sure[employer] is required/requested.   Insurance verification completed.   The patient is insured through [employer] .   Per test claim: APPROVED from 12/18/2022 to 12/18/2023. Ran test claim, Copay is $24.99. This test claim was processed through [facility]- copay amounts may vary at other pharmacies due to pharmacy/plan contracts, or as the patient moves through the different stages of their insurance plan.

## 2022-12-18 NOTE — Telephone Encounter (Signed)
Calcium score 12/17/22 came back at 486 (94th percentile) diagnostic for CAD. Will submit Repatha prior auth under ASCVD indication. Unable to do so on CMM, I had started a request the other day but never submitted it and now CMM will not let me submit a new one. Pharm tech team - are you able to call pt's insurance plan to submit Repatha PA please? Indication is ASCVD (CAD with calcium score over 400 - details from this report are under chart review, imaging tab, coronary morph from 8/8) - updated LDL goal is < 70 due to new diagnosis of CAD. Other details found in my recent chart note from 7/31, thank you!

## 2022-12-18 NOTE — Telephone Encounter (Signed)
Update:  Please note the initial denial listed below was originally denied(so please disregard any fax that you may or may not see stating a denial. I have completed a Peer-to-peer and Rx for Repatha &  it is Approved. I will attached the approval when its received  NEW AUTH# [id number] EFFECTIVE DATES: 8.19.24-8.19.25

## 2022-12-18 NOTE — Telephone Encounter (Signed)
Pt is requesting a callback regarding his results from CT yesterday. Please advise

## 2022-12-18 NOTE — Addendum Note (Signed)
Addended by: Harvest Stanco E on: 12/18/2022 02:44 PM   Modules accepted: Orders

## 2022-12-18 NOTE — Telephone Encounter (Signed)
Pt notified of approval, rx sent in along with copay card.

## 2022-12-18 NOTE — Telephone Encounter (Signed)
Called pt reports reviewed results and MD comments on my chart while waiting for return call from our office.  Advised our pharmacy staff is working on getting Repatha approved.  Pt had no further questions or concerns.

## 2023-01-04 DIAGNOSIS — G4733 Obstructive sleep apnea (adult) (pediatric): Secondary | ICD-10-CM | POA: Diagnosis not present

## 2023-01-04 DIAGNOSIS — E7800 Pure hypercholesterolemia, unspecified: Secondary | ICD-10-CM | POA: Diagnosis not present

## 2023-01-04 DIAGNOSIS — I2510 Atherosclerotic heart disease of native coronary artery without angina pectoris: Secondary | ICD-10-CM | POA: Diagnosis not present

## 2023-01-04 DIAGNOSIS — M109 Gout, unspecified: Secondary | ICD-10-CM | POA: Diagnosis not present

## 2023-01-04 DIAGNOSIS — R7303 Prediabetes: Secondary | ICD-10-CM | POA: Diagnosis not present

## 2023-02-08 DIAGNOSIS — G4733 Obstructive sleep apnea (adult) (pediatric): Secondary | ICD-10-CM | POA: Diagnosis not present

## 2023-02-08 DIAGNOSIS — I10 Essential (primary) hypertension: Secondary | ICD-10-CM | POA: Diagnosis not present

## 2023-02-08 DIAGNOSIS — E7800 Pure hypercholesterolemia, unspecified: Secondary | ICD-10-CM | POA: Diagnosis not present

## 2023-02-08 DIAGNOSIS — M109 Gout, unspecified: Secondary | ICD-10-CM | POA: Diagnosis not present

## 2023-02-09 ENCOUNTER — Ambulatory Visit: Payer: BC Managed Care – PPO | Attending: Family Medicine

## 2023-02-12 ENCOUNTER — Telehealth: Payer: Self-pay | Admitting: Pharmacist

## 2023-02-12 NOTE — Telephone Encounter (Addendum)
Pt missed lipid appt the other day to check cholesterol since starting Repatha.  Left message for pt to r/s. This is 2nd message I have left, also left pt a message on 10/2.

## 2023-02-24 NOTE — Telephone Encounter (Signed)
Called pt again, left 3rd message. Will await his return call at this time.

## 2023-03-09 ENCOUNTER — Ambulatory Visit: Payer: BC Managed Care – PPO | Attending: Cardiology

## 2023-03-09 DIAGNOSIS — E782 Mixed hyperlipidemia: Secondary | ICD-10-CM

## 2023-03-10 ENCOUNTER — Telehealth: Payer: Self-pay | Admitting: Pharmacist

## 2023-03-10 ENCOUNTER — Institutional Professional Consult (permissible substitution) (HOSPITAL_BASED_OUTPATIENT_CLINIC_OR_DEPARTMENT_OTHER): Payer: Self-pay | Admitting: Internal Medicine

## 2023-03-10 DIAGNOSIS — E782 Mixed hyperlipidemia: Secondary | ICD-10-CM

## 2023-03-10 LAB — HEPATIC FUNCTION PANEL
ALT: 77 [IU]/L — ABNORMAL HIGH (ref 0–44)
AST: 42 [IU]/L — ABNORMAL HIGH (ref 0–40)
Albumin: 4.4 g/dL (ref 3.8–4.9)
Alkaline Phosphatase: 63 [IU]/L (ref 44–121)
Bilirubin Total: 0.5 mg/dL (ref 0.0–1.2)
Bilirubin, Direct: 0.15 mg/dL (ref 0.00–0.40)
Total Protein: 6.9 g/dL (ref 6.0–8.5)

## 2023-03-10 LAB — LIPID PANEL
Chol/HDL Ratio: 3.5 ratio (ref 0.0–5.0)
Cholesterol, Total: 229 mg/dL — ABNORMAL HIGH (ref 100–199)
HDL: 65 mg/dL (ref >39)
LDL Chol Calc (NIH): 133 mg/dL — ABNORMAL HIGH (ref 0–99)
Triglycerides: 177 mg/dL — ABNORMAL HIGH (ref 0–149)
VLDL Cholesterol Cal: 31 mg/dL (ref 5–40)

## 2023-03-10 NOTE — Telephone Encounter (Signed)
LDL improved from 206 to 133 after starting Repatha (36% drop). Pt intolerant to Crestor, Lipitor, and Zocor (muscle aches). LDL goal < 70 - change from when I saw him in clinic as he has since undergone coronary CTA on 12/17/22 which showed calcium score of 486 (94th percentile). LFTs mildly elevated but < 3x ULN. Spoke with pt, he had issues getting his Repatha filled and has been off it for a month which explains suboptimal response. No issues from our end, sounds like issue at his pharmacy, he's picking up rx today and will inject on the 1st and 15th of each month. Will recheck lipids the first week in December to re-evaluate. If needed, can consider Nexlizet at that time. Pt also reported having 4-5 beers the Saturday before Monday labs were checked which may have affected his LFTs/TGs, he will refrain the weekend before next set of labs.

## 2023-04-12 ENCOUNTER — Ambulatory Visit: Payer: BC Managed Care – PPO | Attending: Family Medicine

## 2023-06-10 DIAGNOSIS — M109 Gout, unspecified: Secondary | ICD-10-CM | POA: Diagnosis not present

## 2023-06-10 DIAGNOSIS — G4733 Obstructive sleep apnea (adult) (pediatric): Secondary | ICD-10-CM | POA: Diagnosis not present

## 2023-06-10 DIAGNOSIS — Z79899 Other long term (current) drug therapy: Secondary | ICD-10-CM | POA: Diagnosis not present

## 2023-06-10 DIAGNOSIS — I10 Essential (primary) hypertension: Secondary | ICD-10-CM | POA: Diagnosis not present

## 2023-06-10 DIAGNOSIS — Z6838 Body mass index (BMI) 38.0-38.9, adult: Secondary | ICD-10-CM | POA: Diagnosis not present

## 2023-06-10 DIAGNOSIS — E7800 Pure hypercholesterolemia, unspecified: Secondary | ICD-10-CM | POA: Diagnosis not present

## 2023-06-21 DIAGNOSIS — M109 Gout, unspecified: Secondary | ICD-10-CM | POA: Diagnosis not present

## 2023-06-21 DIAGNOSIS — I10 Essential (primary) hypertension: Secondary | ICD-10-CM | POA: Diagnosis not present

## 2023-06-21 DIAGNOSIS — E559 Vitamin D deficiency, unspecified: Secondary | ICD-10-CM | POA: Diagnosis not present

## 2023-06-21 DIAGNOSIS — G4733 Obstructive sleep apnea (adult) (pediatric): Secondary | ICD-10-CM | POA: Diagnosis not present

## 2023-06-21 DIAGNOSIS — E7800 Pure hypercholesterolemia, unspecified: Secondary | ICD-10-CM | POA: Diagnosis not present

## 2023-07-12 DIAGNOSIS — E7800 Pure hypercholesterolemia, unspecified: Secondary | ICD-10-CM | POA: Diagnosis not present

## 2023-07-12 DIAGNOSIS — G4733 Obstructive sleep apnea (adult) (pediatric): Secondary | ICD-10-CM | POA: Diagnosis not present

## 2023-07-12 DIAGNOSIS — M109 Gout, unspecified: Secondary | ICD-10-CM | POA: Diagnosis not present

## 2023-07-12 DIAGNOSIS — I10 Essential (primary) hypertension: Secondary | ICD-10-CM | POA: Diagnosis not present

## 2023-07-12 DIAGNOSIS — Z9189 Other specified personal risk factors, not elsewhere classified: Secondary | ICD-10-CM | POA: Diagnosis not present

## 2023-11-23 ENCOUNTER — Telehealth: Payer: Self-pay | Admitting: Pharmacy Technician

## 2023-11-23 ENCOUNTER — Other Ambulatory Visit (HOSPITAL_COMMUNITY): Payer: Self-pay

## 2023-11-23 NOTE — Telephone Encounter (Signed)
 Pharmacy Patient Advocate Encounter   Received notification from CoverMyMeds that prior authorization for Repatha  is required/requested.   Insurance verification completed.   The patient is insured through [employer] .   Per test claim: PA required; PA submitted to above mentioned insurance via CoverMyMeds Key/confirmation #/EOC [id number] Status is pending

## 2023-11-24 ENCOUNTER — Other Ambulatory Visit (HOSPITAL_COMMUNITY): Payer: Self-pay

## 2023-11-24 NOTE — Telephone Encounter (Signed)
 Pharmacy Patient Advocate Encounter  Received notification from [employer] that Prior Authorization for Repatha  has been APPROVED from 11/23/23 to 11/22/24. Ran test claim, Copay is $90.00- 3 months. This test claim was processed through [facility]- copay amounts may vary at other pharmacies due to pharmacy/plan contracts, or as the patient moves through the different stages of their insurance plan.   PA #/Case ID/Reference #: [id number]
